# Patient Record
Sex: Female | Born: 1950 | Race: White | Hispanic: No | Marital: Married
Health system: Southern US, Community
[De-identification: ages and names within clinical notes are randomized; demographics above are authoritative.]

---

## 2016-02-12 ENCOUNTER — Inpatient Hospital Stay
Admission: RE | Admit: 2016-02-12 | Discharge: 2016-02-12 | Disposition: A | Discharge status: Home / Self Care | Payer: Self-pay | Source: Ambulatory Visit | Attending: *Deleted | Admitting: *Deleted

## 2016-02-12 ENCOUNTER — Other Ambulatory Visit: Payer: Self-pay | Admitting: *Deleted

## 2016-02-12 DIAGNOSIS — Z9289 Personal history of other medical treatment: Secondary | ICD-10-CM

## 2016-05-20 ENCOUNTER — Other Ambulatory Visit: Payer: Self-pay | Admitting: Internal Medicine

## 2016-05-20 DIAGNOSIS — Z1231 Encounter for screening mammogram for malignant neoplasm of breast: Secondary | ICD-10-CM

## 2016-05-25 ENCOUNTER — Encounter: Payer: Self-pay | Admitting: Radiology

## 2016-05-25 ENCOUNTER — Ambulatory Visit
Admission: RE | Admit: 2016-05-25 | Discharge: 2016-05-25 | Disposition: A | Discharge status: Home / Self Care | Payer: Medicare Other | Source: Ambulatory Visit | Attending: Internal Medicine | Admitting: Internal Medicine

## 2016-05-25 DIAGNOSIS — Z1231 Encounter for screening mammogram for malignant neoplasm of breast: Secondary | ICD-10-CM | POA: Diagnosis not present

## 2017-05-25 ENCOUNTER — Other Ambulatory Visit: Payer: Self-pay | Admitting: Internal Medicine

## 2017-05-25 DIAGNOSIS — Z1231 Encounter for screening mammogram for malignant neoplasm of breast: Secondary | ICD-10-CM

## 2017-05-26 ENCOUNTER — Ambulatory Visit
Admission: RE | Admit: 2017-05-26 | Discharge: 2017-05-26 | Disposition: A | Discharge status: Home / Self Care | Payer: Medicare Other | Source: Ambulatory Visit | Attending: Internal Medicine | Admitting: Internal Medicine

## 2017-05-26 DIAGNOSIS — Z1231 Encounter for screening mammogram for malignant neoplasm of breast: Secondary | ICD-10-CM | POA: Diagnosis present

## 2018-09-02 ENCOUNTER — Other Ambulatory Visit: Payer: Self-pay | Admitting: Internal Medicine

## 2018-09-02 DIAGNOSIS — Z1231 Encounter for screening mammogram for malignant neoplasm of breast: Secondary | ICD-10-CM

## 2018-09-20 ENCOUNTER — Ambulatory Visit
Admission: RE | Admit: 2018-09-20 | Discharge: 2018-09-20 | Disposition: A | Discharge status: Home / Self Care | Payer: Medicare Other | Source: Ambulatory Visit | Attending: Internal Medicine | Admitting: Internal Medicine

## 2018-09-20 ENCOUNTER — Encounter (INDEPENDENT_AMBULATORY_CARE_PROVIDER_SITE_OTHER): Payer: Self-pay

## 2018-09-20 ENCOUNTER — Other Ambulatory Visit: Payer: Self-pay

## 2018-09-20 DIAGNOSIS — Z1231 Encounter for screening mammogram for malignant neoplasm of breast: Secondary | ICD-10-CM | POA: Diagnosis not present

## 2019-09-22 ENCOUNTER — Ambulatory Visit
Admission: RE | Admit: 2019-09-22 | Discharge: 2019-09-22 | Disposition: A | Discharge status: Home / Self Care | Payer: Medicare Other | Source: Ambulatory Visit | Attending: Chiropractic Medicine | Admitting: Chiropractic Medicine

## 2019-09-22 ENCOUNTER — Other Ambulatory Visit: Payer: Self-pay

## 2019-09-22 ENCOUNTER — Ambulatory Visit
Admission: RE | Admit: 2019-09-22 | Discharge: 2019-09-22 | Disposition: A | Discharge status: Home / Self Care | Payer: Medicare Other | Attending: Chiropractic Medicine | Admitting: Chiropractic Medicine

## 2019-09-22 ENCOUNTER — Other Ambulatory Visit: Payer: Self-pay | Admitting: Chiropractic Medicine

## 2019-09-22 DIAGNOSIS — M25532 Pain in left wrist: Secondary | ICD-10-CM

## 2020-01-17 ENCOUNTER — Other Ambulatory Visit: Payer: Self-pay | Admitting: Internal Medicine

## 2020-01-17 DIAGNOSIS — Z1231 Encounter for screening mammogram for malignant neoplasm of breast: Secondary | ICD-10-CM

## 2020-02-22 ENCOUNTER — Ambulatory Visit
Admission: RE | Admit: 2020-02-22 | Discharge: 2020-02-22 | Disposition: A | Discharge status: Home / Self Care | Payer: Medicare Other | Source: Ambulatory Visit | Attending: Internal Medicine | Admitting: Internal Medicine

## 2020-02-22 ENCOUNTER — Other Ambulatory Visit: Payer: Self-pay

## 2020-02-22 DIAGNOSIS — Z1231 Encounter for screening mammogram for malignant neoplasm of breast: Secondary | ICD-10-CM | POA: Insufficient documentation

## 2021-06-24 ENCOUNTER — Other Ambulatory Visit: Payer: Self-pay | Admitting: Internal Medicine

## 2021-06-24 DIAGNOSIS — Z1231 Encounter for screening mammogram for malignant neoplasm of breast: Secondary | ICD-10-CM

## 2021-06-30 ENCOUNTER — Ambulatory Visit
Admission: RE | Admit: 2021-06-30 | Discharge: 2021-06-30 | Disposition: A | Discharge status: Home / Self Care | Payer: Medicare Other | Source: Ambulatory Visit | Attending: Internal Medicine | Admitting: Internal Medicine

## 2021-06-30 DIAGNOSIS — Z1231 Encounter for screening mammogram for malignant neoplasm of breast: Secondary | ICD-10-CM | POA: Diagnosis not present

## 2022-08-24 ENCOUNTER — Ambulatory Visit: Payer: Medicare Other

## 2022-08-24 DIAGNOSIS — Z719 Counseling, unspecified: Secondary | ICD-10-CM

## 2022-08-24 DIAGNOSIS — Z23 Encounter for immunization: Secondary | ICD-10-CM

## 2022-08-24 NOTE — Progress Notes (Signed)
Patient seen in nurse clinic in need of a Tetanus vaccine.  Patient cut her arm yesterday in her barn.  The last record she has of a Tetanus vaccine was 7 but thinks she had had one since then but not in the past 10 years. Patient only wants Td not Tdap.  VIS provided. Td given IM right deltoid.  Tolerated well. [facility] updated and copy provided to patient.

## 2022-12-24 ENCOUNTER — Other Ambulatory Visit: Payer: Self-pay | Admitting: Internal Medicine

## 2022-12-24 DIAGNOSIS — Z1231 Encounter for screening mammogram for malignant neoplasm of breast: Secondary | ICD-10-CM

## 2023-01-11 ENCOUNTER — Ambulatory Visit
Admission: RE | Admit: 2023-01-11 | Discharge: 2023-01-11 | Disposition: A | Discharge status: Home / Self Care | Payer: Medicare Other | Source: Ambulatory Visit | Attending: Internal Medicine | Admitting: Internal Medicine

## 2023-01-11 DIAGNOSIS — Z1231 Encounter for screening mammogram for malignant neoplasm of breast: Secondary | ICD-10-CM | POA: Insufficient documentation

## 2023-03-25 ENCOUNTER — Other Ambulatory Visit: Payer: Self-pay | Admitting: Internal Medicine

## 2023-03-25 DIAGNOSIS — I10 Essential (primary) hypertension: Secondary | ICD-10-CM

## 2023-03-25 DIAGNOSIS — E782 Mixed hyperlipidemia: Secondary | ICD-10-CM

## 2023-04-07 ENCOUNTER — Ambulatory Visit
Admission: RE | Admit: 2023-04-07 | Discharge: 2023-04-07 | Disposition: A | Discharge status: Home / Self Care | Payer: Self-pay | Source: Ambulatory Visit | Attending: Internal Medicine | Admitting: Internal Medicine

## 2023-04-07 DIAGNOSIS — E782 Mixed hyperlipidemia: Secondary | ICD-10-CM | POA: Insufficient documentation

## 2023-04-07 DIAGNOSIS — I10 Essential (primary) hypertension: Secondary | ICD-10-CM | POA: Insufficient documentation

## 2023-09-07 IMAGING — MG MM DIGITAL SCREENING BILAT W/ TOMO AND CAD
8 series · 9 of 24 positions shown · non-contrast
Comparison: Previous exam(s).

CLINICAL DATA: Screening.

EXAM:
DIGITAL SCREENING BILATERAL MAMMOGRAM WITH TOMOSYNTHESIS AND CAD
TECHNIQUE: Bilateral screening digital craniocaudal and mediolateral oblique
mammograms were obtained. Bilateral screening digital breast
tomosynthesis was performed. The images were evaluated with
computer-aided detection.

[R MLO synth-2D]
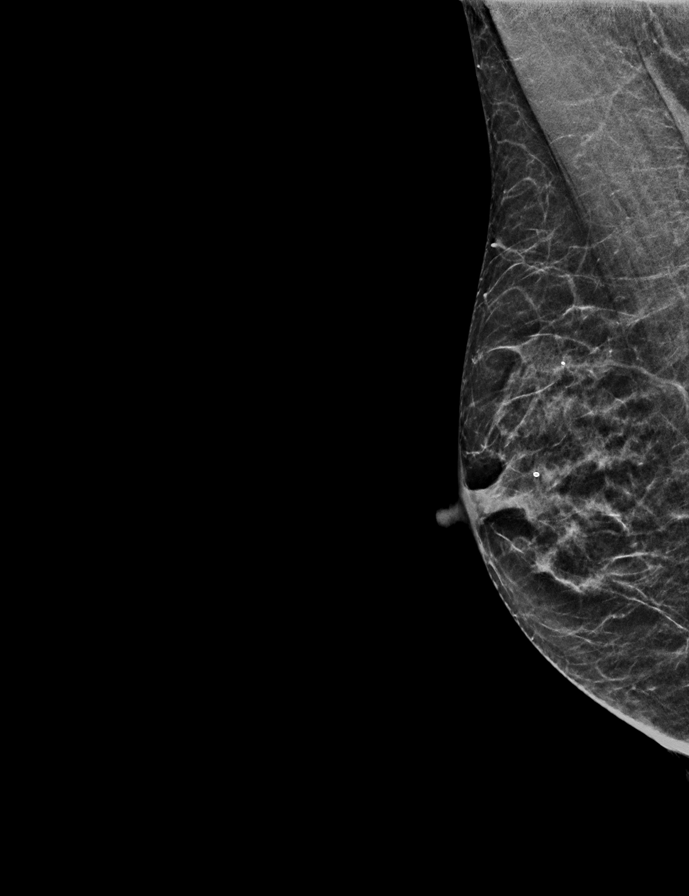

[R CC synth-2D]
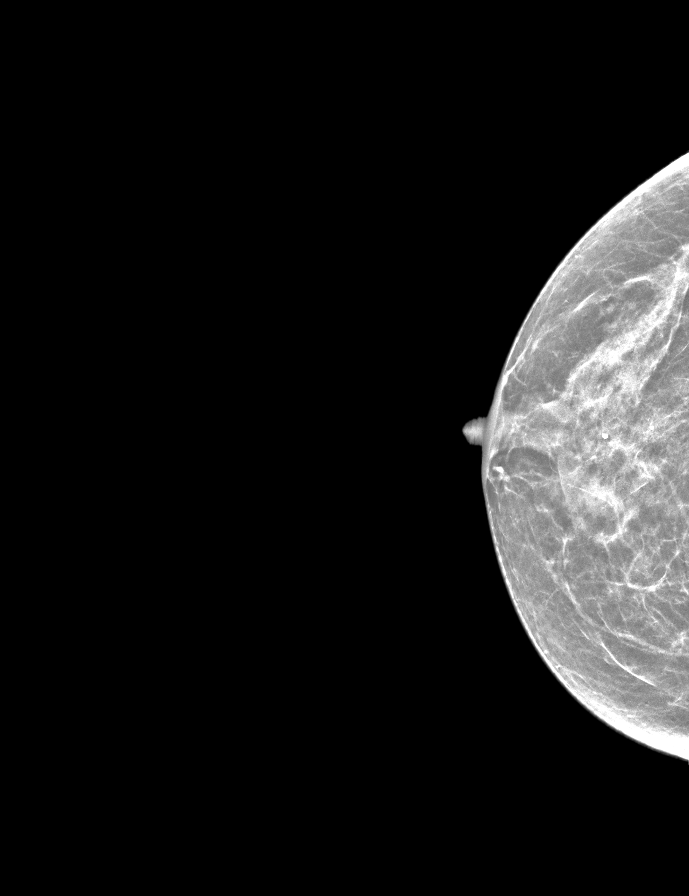

[L CC synth-2D]
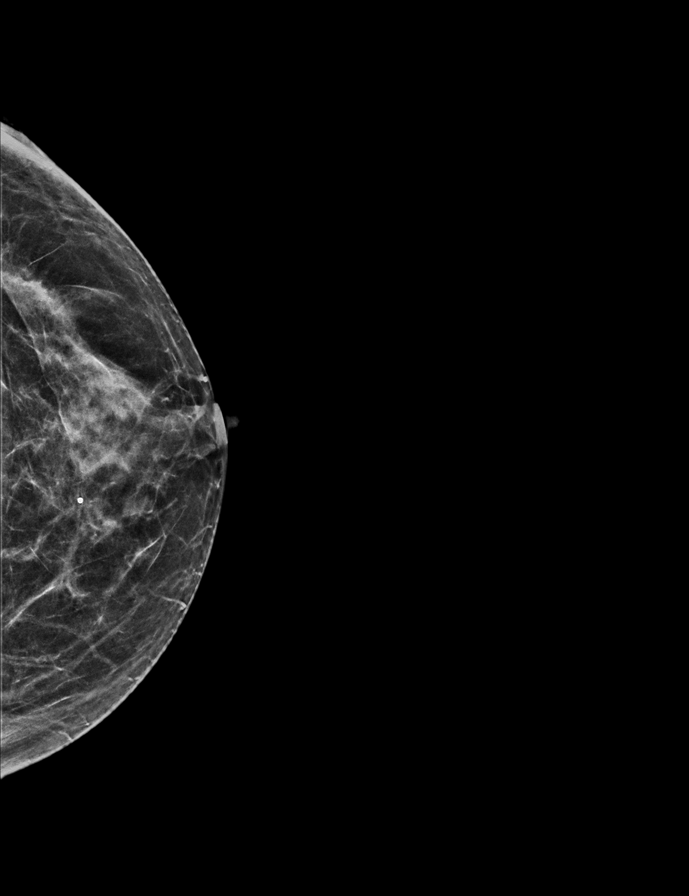

[L MLO synth-2D]
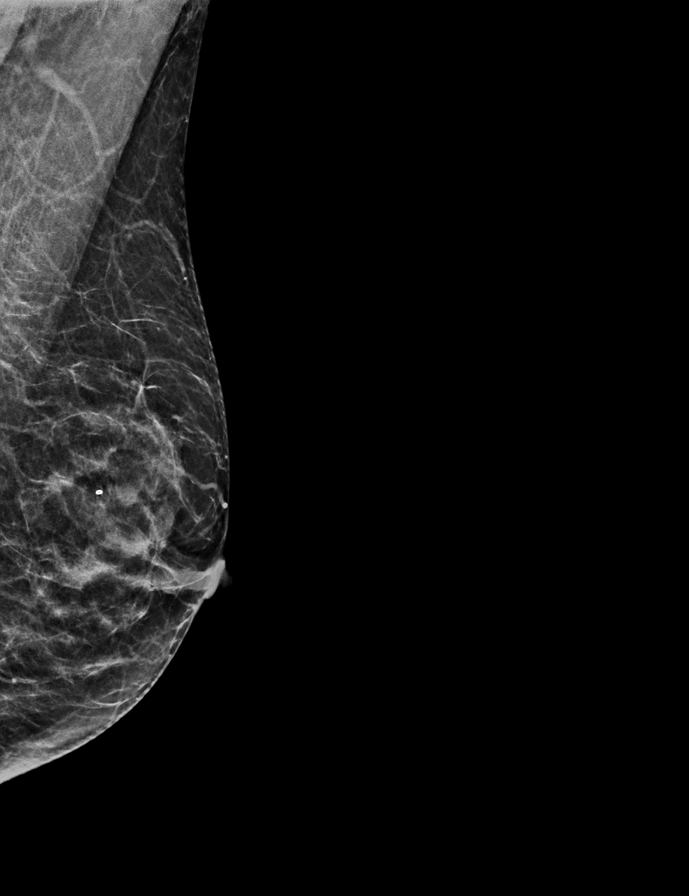

[L MLO tomo · 2 of 48 frames shown]
[frame 16/48]
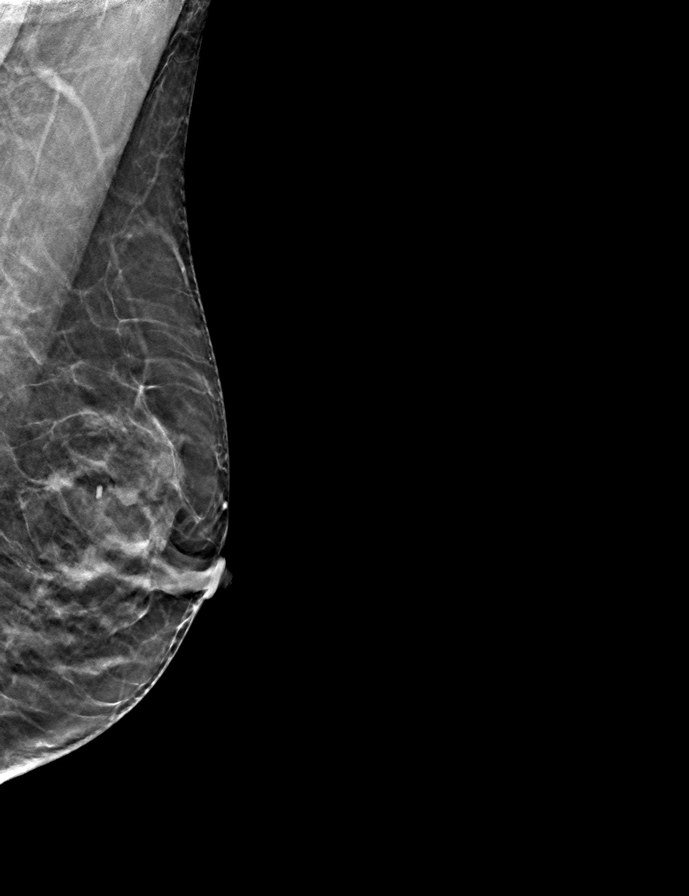
[frame 25/48]
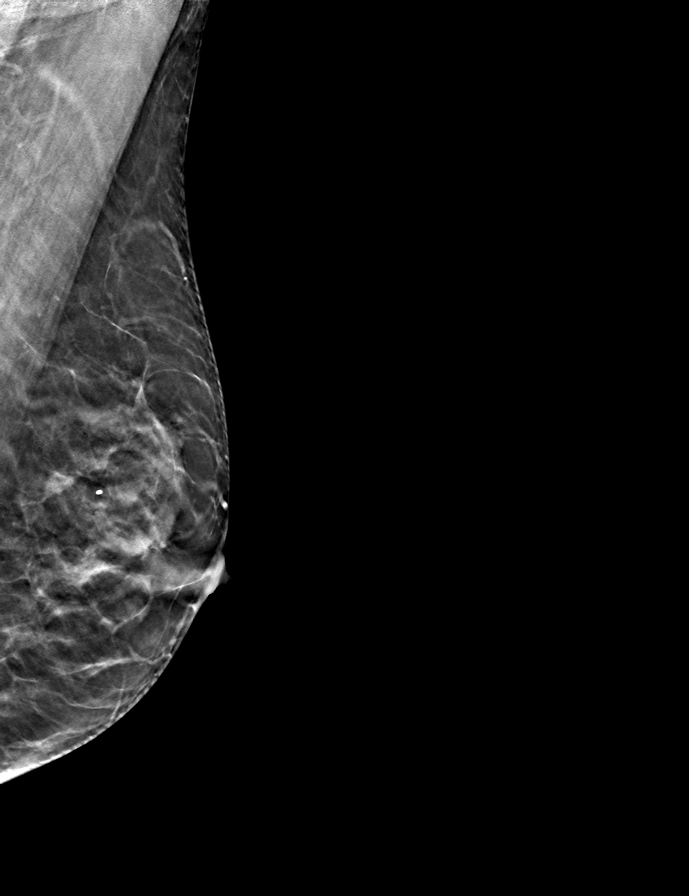

[R CC tomo · tomo slice 22/43.0]
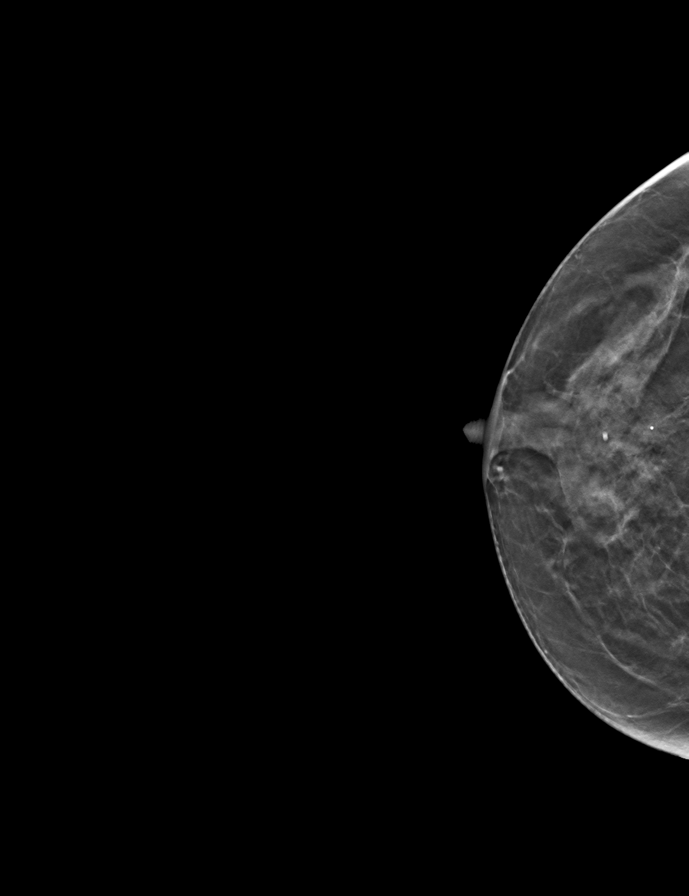

[L CC tomo · tomo slice 26/51.0]
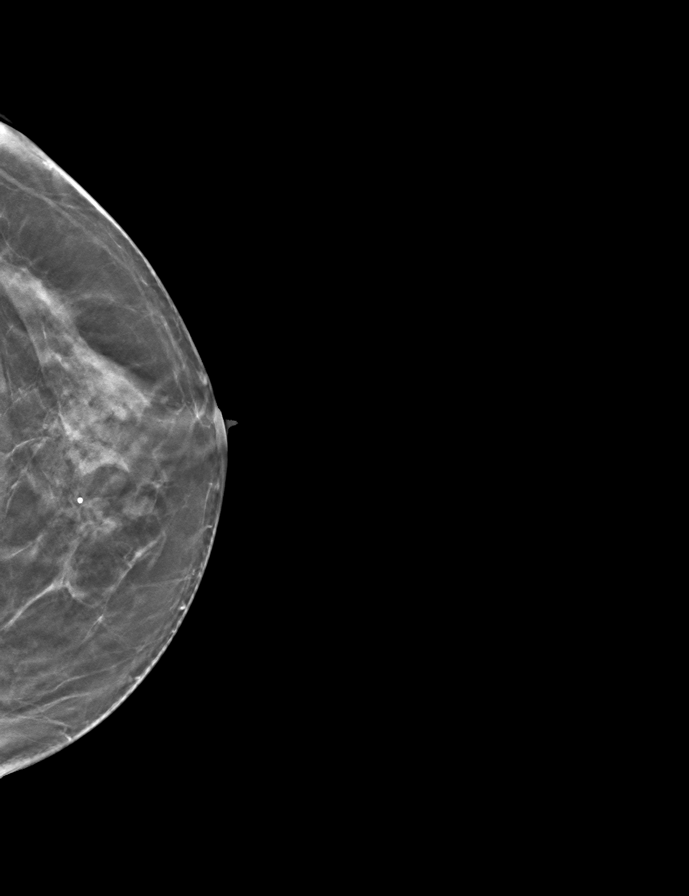

[R MLO tomo · tomo slice 21/42.0]
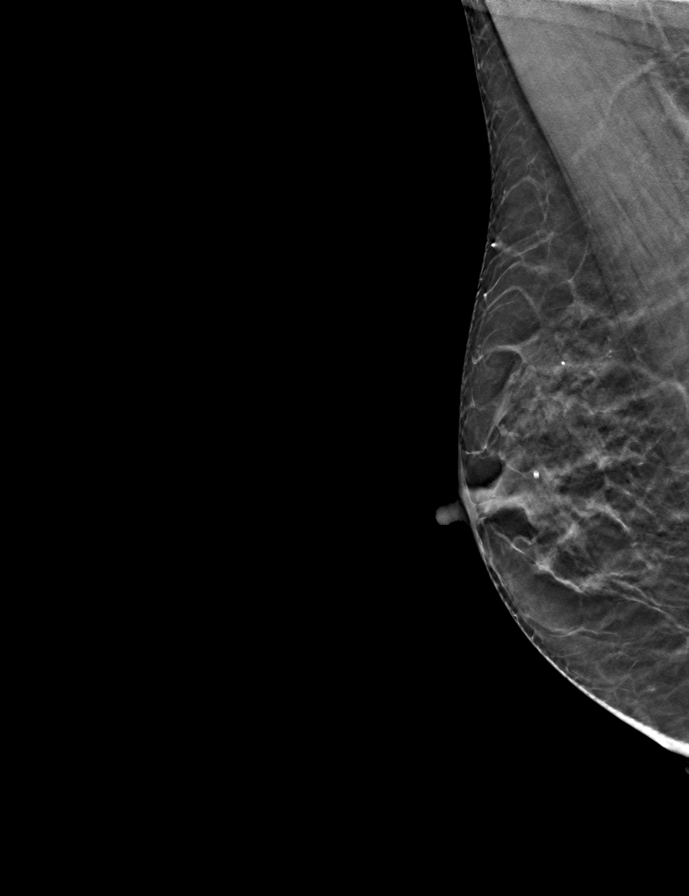

[9 of 24 positions shown; findings below may reference images not displayed]

ACR Breast Density Category c: The breast tissue is heterogeneously
dense, which may obscure small masses.
FINDINGS: There are no findings suspicious for malignancy.
IMPRESSION: No mammographic evidence of malignancy. A result letter of this
screening mammogram will be mailed directly to the patient.

RECOMMENDATION:
Screening mammogram in one year. (Code:Q3-W-BC3)

BI-RADS CATEGORY  1: Negative.

## 2024-02-21 ENCOUNTER — Other Ambulatory Visit: Payer: Self-pay | Admitting: Internal Medicine

## 2024-02-21 DIAGNOSIS — Z1231 Encounter for screening mammogram for malignant neoplasm of breast: Secondary | ICD-10-CM

## 2024-03-21 ENCOUNTER — Ambulatory Visit
Admission: RE | Admit: 2024-03-21 | Discharge: 2024-03-21 | Disposition: A | Source: Ambulatory Visit | Attending: Internal Medicine | Admitting: Internal Medicine

## 2024-03-21 DIAGNOSIS — Z1231 Encounter for screening mammogram for malignant neoplasm of breast: Secondary | ICD-10-CM
